# Patient Record
Sex: Male | Born: 1965 | Race: White | Hispanic: No | Marital: Married
Health system: Southern US, Community
[De-identification: ages and names within clinical notes are randomized; demographics above are authoritative.]

---

## 1998-09-30 ENCOUNTER — Encounter: Payer: Self-pay | Admitting: Family Medicine

## 1998-09-30 ENCOUNTER — Ambulatory Visit (HOSPITAL_COMMUNITY): Admission: RE | Admit: 1998-09-30 | Discharge: 1998-09-30 | Payer: Self-pay | Admitting: Family Medicine

## 2020-09-23 ENCOUNTER — Ambulatory Visit: Payer: Self-pay

## 2020-09-23 ENCOUNTER — Ambulatory Visit (INDEPENDENT_AMBULATORY_CARE_PROVIDER_SITE_OTHER): Payer: BC Managed Care – PPO | Admitting: Orthopaedic Surgery

## 2020-09-23 ENCOUNTER — Encounter: Payer: Self-pay | Admitting: Orthopaedic Surgery

## 2020-09-23 DIAGNOSIS — M79641 Pain in right hand: Secondary | ICD-10-CM | POA: Diagnosis not present

## 2020-09-23 DIAGNOSIS — M79672 Pain in left foot: Secondary | ICD-10-CM

## 2020-09-23 NOTE — Progress Notes (Signed)
Office Visit Note   Patient: Cristian Ellis           Date of Birth: 10-Apr-1966           MRN: [id number] Visit Date: 09/23/2020              Requested by: No referring provider defined for this encounter. PCP: No primary care provider on file.   Assessment & Plan: Visit Diagnoses:  1. Pain of left heel   2. Pain in right hand     Plan: Impression is right wrist pain and left heel mass.  For the wrist my suspicion is that he has got an overuse injury which gets better with rest.  Treatment recommendations were made for rest, ice, NSAIDs, mobilization as needed.  He is not overly concerned about this.  He may try Mobic again.  For the left heel this is more concerning to him and given the fact that there is a mass and has not gotten any better we need to get an MRI to fully evaluate.  I will be in touch with him regarding the results of the MRI.  Follow-Up Instructions: Return if symptoms worsen or fail to improve.   Orders:  Orders Placed This Encounter  Procedures  . XR Os Calcis Left  . XR Hand Complete Right   No orders of the defined types were placed in this encounter.     Procedures: No procedures performed   Clinical Data: No additional findings.   Subjective: Chief Complaint  Patient presents with  . Left Foot - Pain    Heel pain   . Right Wrist - Pain    Thumb pain     Cristian Ellis is a 55 year old gentleman who I know from [place] who comes in for evaluation of 2 separate issues.  The first 1 is right wrist pain which is localized to the volar aspect just radial to the FCR tendon.  He notices swelling and discomfort with increased activity such as playing tennis.  Rest has helped.  He received a cortisone injection by Dr. Grandville Silos at [place] which did not help.  Denies any numbness and tingling or burning sensation.  Mobic really helped and he denies any night pain.  He denies any triggering.  For the left heel he endorses a  sensation of a plantar mass just distal to the calcaneus.  Denies any injuries.  He reports a history of having different cysts removed from his foot but not sure which foot.  Denies any numbness and tingling.  The pain is worse in the morning when he first walks on it and he feels like he is stepping on a marshmallow.  This has been going on for about 4 months.   Review of Systems  Constitutional: Negative.   All other systems reviewed and are negative.    Objective: Vital Signs: There were no vitals taken for this visit.  Physical Exam Vitals and nursing note reviewed.  Constitutional:      Appearance: He is well-developed and well-nourished.  HENT:     Head: Normocephalic and atraumatic.  Eyes:     Pupils: Pupils are equal, round, and reactive to light.  Pulmonary:     Effort: Pulmonary effort is normal.  Abdominal:     Palpations: Abdomen is soft.  Musculoskeletal:        General: Normal range of motion.     Cervical back: Neck supple.  Skin:    General:  Skin is warm.  Neurological:     Mental Status: He is alert and oriented to person, place, and time.  Psychiatric:        Mood and Affect: Mood and affect normal.        Behavior: Behavior normal.        Thought Content: Thought content normal.        Judgment: Judgment normal.     Ortho Exam Right wrist shows normal range of motion and strength.  He has a negative grind test.  There is no triggering.  Negative carpal tunnel compressive signs.  He has some increased pain with wrist flexion.  No real tenderness of the FCR tendon.  Negative Finkelstein's.  Left heel shows a palpable soft tissue mass in the arch.  This is ill-defined.  The heel is nontender.  Lateral compression is negative.  Achilles insertion is nontender. Specialty Comments:  No specialty comments available.  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  No acute  bony abnormalities.  XR Hand Complete Right  Result Date: 09/23/2020 No acute abnormalities.  Mild thumb basal joint arthritis    PMFS History: There are no problems to display for this patient.  History reviewed. No pertinent past medical history.  History reviewed. No pertinent family history.  History reviewed. No pertinent surgical history. Social History   Occupational History  . Not on file  Tobacco Use  . Smoking status: Not on file  . Smokeless tobacco: Not on file  Substance and Sexual Activity  . Alcohol use: Not on file  . Drug use: Not on file  . Sexual activity: Not on file

## 2020-09-23 NOTE — Addendum Note (Signed)
Addended by: Precious Bard on: 09/23/2020 02:15 PM   Modules accepted: Orders

## 2020-09-25 ENCOUNTER — Telehealth: Payer: Self-pay

## 2020-09-25 NOTE — Telephone Encounter (Signed)
Order changed.

## 2020-09-25 NOTE — Telephone Encounter (Signed)
[facility] called and stated the orders for the heel and ankle need to be changed to with and without contrast. Bernice with [facility] cb # [phone] if you have any questions.

## 2020-10-23 ENCOUNTER — Other Ambulatory Visit: Payer: Self-pay

## 2020-10-23 ENCOUNTER — Ambulatory Visit
Admission: RE | Admit: 2020-10-23 | Discharge: 2020-10-23 | Disposition: A | Discharge status: Home / Self Care | Payer: BC Managed Care – PPO | Source: Ambulatory Visit | Attending: Orthopaedic Surgery | Admitting: Orthopaedic Surgery

## 2020-10-23 DIAGNOSIS — M79672 Pain in left foot: Secondary | ICD-10-CM

## 2020-10-23 MED ORDER — GADOBENATE DIMEGLUMINE 529 MG/ML IV SOLN
19.0000 mL | Freq: Once | INTRAVENOUS | Status: AC | PRN
  Administered 2020-10-23: 19 mL via INTRAVENOUS

## 2020-10-25 NOTE — Progress Notes (Signed)
Left voice mail

## 2020-11-27 ENCOUNTER — Other Ambulatory Visit: Payer: Self-pay | Admitting: Physician Assistant

## 2020-11-27 ENCOUNTER — Telehealth: Payer: Self-pay | Admitting: Orthopaedic Surgery

## 2020-11-27 MED ORDER — MELOXICAM 7.5 MG PO TABS: 7.5000 mg | ORAL_TABLET | Freq: Every day | ORAL | 2 refills | Status: DC | PRN

## 2020-11-27 NOTE — Telephone Encounter (Signed)
Patient's wife Shirlean Mylar called requesting a prescription meloxicam. Please send to [facility] on [address]. Patient phone number is [phone].

## 2020-11-27 NOTE — Telephone Encounter (Signed)
Sent in

## 2020-11-27 NOTE — Telephone Encounter (Signed)
Please advise 

## 2020-11-27 NOTE — Telephone Encounter (Signed)
Lvm informing 

## 2021-02-09 ENCOUNTER — Ambulatory Visit (HOSPITAL_COMMUNITY)
Admission: RE | Admit: 2021-02-09 | Discharge: 2021-02-09 | Disposition: A | Discharge status: Home / Self Care | Payer: BC Managed Care – PPO | Source: Ambulatory Visit | Attending: Orthopaedic Surgery | Admitting: Orthopaedic Surgery

## 2021-02-09 ENCOUNTER — Ambulatory Visit: Payer: Self-pay

## 2021-02-09 ENCOUNTER — Ambulatory Visit: Payer: BC Managed Care – PPO | Admitting: Orthopaedic Surgery

## 2021-02-09 ENCOUNTER — Telehealth: Payer: Self-pay

## 2021-02-09 ENCOUNTER — Encounter: Payer: Self-pay | Admitting: Orthopaedic Surgery

## 2021-02-09 ENCOUNTER — Other Ambulatory Visit: Payer: Self-pay

## 2021-02-09 VITALS — Ht 74.0 in | Wt 205.0 lb

## 2021-02-09 DIAGNOSIS — M7989 Other specified soft tissue disorders: Secondary | ICD-10-CM

## 2021-02-09 DIAGNOSIS — G8929 Other chronic pain: Secondary | ICD-10-CM

## 2021-02-09 DIAGNOSIS — M25561 Pain in right knee: Secondary | ICD-10-CM | POA: Diagnosis not present

## 2021-02-09 DIAGNOSIS — M549 Dorsalgia, unspecified: Secondary | ICD-10-CM | POA: Diagnosis not present

## 2021-02-09 DIAGNOSIS — M79661 Pain in right lower leg: Secondary | ICD-10-CM | POA: Diagnosis not present

## 2021-02-09 MED ORDER — LIDOCAINE HCL 1 % IJ SOLN
2.0000 mL | INTRAMUSCULAR | Status: AC | PRN
  Administered 2021-02-09: 2 mL

## 2021-02-09 MED ORDER — METHYLPREDNISOLONE ACETATE 40 MG/ML IJ SUSP
40.0000 mg | INTRAMUSCULAR | Status: AC | PRN
  Administered 2021-02-09: 40 mg via INTRA_ARTICULAR

## 2021-02-09 MED ORDER — BUPIVACAINE HCL 0.25 % IJ SOLN
2.0000 mL | INTRAMUSCULAR | Status: AC | PRN
  Administered 2021-02-09: 2 mL via INTRA_ARTICULAR

## 2021-02-09 NOTE — Progress Notes (Signed)
Office Visit Note   Patient: Cristian Ellis           Date of Birth: Aug 25, 1965           MRN: [id number] Visit Date: 02/09/2021              Requested by: Merrilee Seashore, [address] [place],  [place] [address] PCP: Merrilee Seashore, MD   Assessment & Plan: Visit Diagnoses:  1. Chronic pain of right knee   2. Upper back pain   3. Pain and swelling of right lower leg     Plan: Impression is right knee probable early arthritis flareup, right calf pain and upper thoracic spine pain.  In regards to the knee, feel his pain is likely coming from early arthritis given his symptoms.  We have discussed cortisone injection today for which she would like to proceed.  In regards to the calf pain, I believe this is more likely coming from walking with an antalgic gait from his underlying knee pain rather than a true DVT, however I feel it is appropriate to order a Doppler ultrasound to rule this out.  In regards to the upper thoracic spine pain, I feel as though he would benefit from a course of physical therapy for: Strengthening exercises.  He will follow-up with [place] as needed.  Follow-Up Instructions: Return if symptoms worsen or fail to improve.   Orders:  Orders Placed This Encounter  Procedures   Large Joint Inj: R knee   XR KNEE 3 VIEW RIGHT   XR Cervical Spine 2 or 3 views   XR Thoracic Spine 2 View   Ambulatory referral to Physical Therapy   VAS [place] LOWER EXTREMITY VENOUS (DVT)   No orders of the defined types were placed in this encounter.     Procedures: Large Joint Inj: R knee on 02/09/2021 8:47 AM Indications: pain Details: 22 G needle, anterolateral approach Medications: 2 mL lidocaine 1 %; 2 mL bupivacaine 0.25 %; 40 mg methylPREDNISolone acetate 40 MG/ML     Clinical Data: No additional findings.   Subjective: Chief Complaint  Patient presents with   Right Knee - Pain    HPI patient is a pleasant 55 year old gentleman who comes in  today with right knee pain, right calf pain and upper thoracic spine pain.  In regards to his back, he has had pain here for the past few months.  No known injury or change in activity.  The pain is primarily to the anterolateral aspect.  No mechanical symptoms.  He has increased pain with sitting to standing.  He has been taking Advil which minimally helps.  He denies any previous cortisone injection or surgical intervention.  In regards to his calf pain, he has had pain here for the past 5 days.  No known injury or change in activity.  No history of smoking.  No personal or family history of DVT/PE.  No chest pain or shortness of breath.  In regards to the pain in his thoracic spine, he has had pain here for many months.  He feels like he is losing strength.  No paresthesias to either upper or lower extremities. Review of Systems as detailed in HPI.  All others reviewed and are negative.   Objective: Vital Signs: Ht 6\' 2"  (1.88 m)   Wt 205 lb (93 kg)   BMI 26.32 kg/m   Physical Exam well-developed well-nourished gentleman in no acute distress.  Alert and oriented x3.  Ortho  Exam right knee exam shows trace effusion.  Range of motion 5 to 120 degrees.  Minimal medial and lateral joint line tenderness.  Ligaments are stable.  He is neurovascular intact distally peer  Specialty Comments:  No specialty comments available.    Imaging: XR Thoracic Spine 2 View  Result Date: 02/09/2021 No acute or structural abnormalities  XR Cervical Spine 2 or 3 views  Result Date: 02/09/2021 Mild diffuse degenerative changes  XR KNEE 3 VIEW RIGHT  Result Date: 02/09/2021 Mild to moderate degenerative changes to the medial joint line.    PMFS History: There are no problems to display for this patient.  History reviewed. No pertinent past medical history.  History reviewed. No pertinent family history.  History reviewed. No pertinent surgical history. Social History   Occupational History   Not on  file  Tobacco Use   Smoking status: Not on file   Smokeless tobacco: Not on file  Substance and Sexual Activity   Alcohol use: Not on file   Drug use: Not on file   Sexual activity: Not on file

## 2021-02-09 NOTE — Telephone Encounter (Signed)
thanks

## 2021-02-09 NOTE — Telephone Encounter (Signed)
FYI  [facility] called and stated pt was negative for DVT on right leg

## 2021-03-23 ENCOUNTER — Other Ambulatory Visit: Payer: Self-pay | Admitting: Physician Assistant

## 2021-03-29 ENCOUNTER — Telehealth: Payer: Self-pay | Admitting: Physician Assistant

## 2021-03-29 NOTE — Telephone Encounter (Signed)
Called patient got recording patient has mailbox that has not been set up yet     will try back later

## 2021-03-29 NOTE — Telephone Encounter (Signed)
Patient's wife Shirlean Mylar called needing Rx refilled Meloxicam foe the patient. The number to contact Shirlean Mylar is [phone]

## 2021-03-30 ENCOUNTER — Other Ambulatory Visit: Payer: Self-pay | Admitting: Physician Assistant

## 2021-03-30 MED ORDER — MELOXICAM 7.5 MG PO TABS: 7.5000 mg | ORAL_TABLET | Freq: Every day | ORAL | 2 refills | Status: DC | PRN

## 2021-03-30 NOTE — Telephone Encounter (Signed)
Sent in

## 2022-01-17 NOTE — Progress Notes (Unsigned)
    Subjective:    CC: L shoulder pain  I, Molly Weber, LAT, ATC, am serving as scribe for Dr. Lynne Leader.  HPI: Pt is a 56 y/o male presenting w/ c/o L shoulder pain x .  He locates his pain to .  Radiating pain: L shoulder mechanical symptoms: Aggravating factors: Treatments tried:   Pertinent review of Systems: ***  Relevant historical information: ***   Objective:   There were no vitals filed for this visit. General: Well Developed, well nourished, and in no acute distress.   MSK: ***  Lab and Radiology Results No results found for this or any previous visit (from the past 72 hour(s)). No results found.    Impression and Recommendations:    Assessment and Plan: 56 y.o. male with ***.  PDMP not reviewed this encounter. No orders of the defined types were placed in this encounter.  No orders of the defined types were placed in this encounter.   Discussed warning signs or symptoms. Please see discharge instructions. Patient expresses understanding.   ***

## 2022-01-18 ENCOUNTER — Ambulatory Visit: Payer: Self-pay

## 2022-01-18 ENCOUNTER — Ambulatory Visit: Payer: BC Managed Care – PPO | Admitting: Family Medicine

## 2022-01-18 VITALS — BP 92/66 | HR 74 | Ht 74.0 in | Wt 204.4 lb

## 2022-01-18 DIAGNOSIS — L4050 Arthropathic psoriasis, unspecified: Secondary | ICD-10-CM | POA: Insufficient documentation

## 2022-01-18 DIAGNOSIS — C439 Malignant melanoma of skin, unspecified: Secondary | ICD-10-CM | POA: Insufficient documentation

## 2022-01-18 DIAGNOSIS — M25512 Pain in left shoulder: Secondary | ICD-10-CM | POA: Diagnosis not present

## 2022-01-18 DIAGNOSIS — M1990 Unspecified osteoarthritis, unspecified site: Secondary | ICD-10-CM | POA: Insufficient documentation

## 2022-01-18 DIAGNOSIS — G8929 Other chronic pain: Secondary | ICD-10-CM

## 2022-01-18 NOTE — Patient Instructions (Addendum)
Good to see you today.  You had a L shoulder injection.  Call or go to the ER if you develop a large red swollen joint with extreme pain or oozing puss.   I've referred you to Physical Therapy at [facility].  Their office will call you to schedule but please let us know if you don't hear from them in one week regarding scheduling.  Follow-up: 6 weeks

## 2022-01-26 ENCOUNTER — Encounter: Payer: Self-pay | Admitting: Physical Therapy

## 2022-01-26 ENCOUNTER — Ambulatory Visit (INDEPENDENT_AMBULATORY_CARE_PROVIDER_SITE_OTHER): Payer: BC Managed Care – PPO | Admitting: Physical Therapy

## 2022-01-26 DIAGNOSIS — M6281 Muscle weakness (generalized): Secondary | ICD-10-CM

## 2022-01-26 DIAGNOSIS — M25512 Pain in left shoulder: Secondary | ICD-10-CM

## 2022-01-26 NOTE — Therapy (Signed)
OUTPATIENT PHYSICAL THERAPY SHOULDER EVALUATION   Patient Name: Cristian Ellis MRN: [id number] DOB:1965-09-16, 56 y.o., male Today's Date: 01/26/2022   PT End of Session - 01/26/22 1301     Visit Number 1    Number of Visits 16    Date for PT Re-Evaluation 03/23/22    Authorization Type BCBS    PT Start Time 1302    PT Stop Time 1341    PT Time Calculation (min) 39 min             History reviewed. No pertinent past medical history. History reviewed. No pertinent surgical history. Patient Active Problem List   Diagnosis Date Noted   Psoriatic arthritis ([place]) 01/18/2022   Osteoarthritis 01/18/2022   Malignant melanoma of skin ([place]) 01/18/2022    PCP: Merrilee Seashore, MD  REFERRING PROVIDER:  Gregor Hams, MD  REFERRING DIAG: [phone] (ICD-10-CM) - Chronic left shoulder pain  THERAPY DIAG:  Acute pain of left shoulder - Plan: PT plan of care cert/re-cert  Muscle weakness (generalized) - Plan: PT plan of care cert/re-cert  Rationale for Evaluation and Treatment Rehabilitation  ONSET DATE: Nov 2022  SUBJECTIVE:                                                                                                                                                                                      SUBJECTIVE STATEMENT: Patient Had a fall  around thanksgiving 2022 and broke fall by reaching out with left arm. It was very jarring and painful but felt better. Continued to hurt with motion but not at rest. Did not get better and sought out MD. Has received two injections in his shoulder, 2nd injection seemed to help more than first.   States that when ever he makes a sudden movement he has pain. But that the pain doesn't linger. States that when he stretches it really hurts and so he doesn't want to stretch it  Reports he plays golf and doesn't want to play golf due to pain. Tennis doesn't bother it but he is right handed. 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   History of possible right frozen shoulder following R RCR.      PERTINENT HISTORY: Psoriatic  arthritis, R RCR with s/p frozen shoulder, Achille's repair  PAIN:  Are you having pain? Yes: NPRS scale: 7/10 Pain location: top of left shoulder deltoid region Pain description: sharp Aggravating factors: movement Relieving factors: rest, no contact  PRECAUTIONS: None  WEIGHT BEARING RESTRICTIONS No  FALLS:  Has patient fallen in last 6 months? No   OCCUPATION: Teacher and basketball coach  PLOF: Independent  PATIENT GOALS to have less pain and get arm back to normal  OBJECTIVE:   DIAGNOSTIC FINDINGS:  No recent xray [place] - no significant findings  COGNITION:  Overall cognitive status: Within functional limits for tasks assessed     SENSATION: WFL  POSTURE: Rounded shoulders, increased kyphosis, forward head     UE Measurements Upper Extremity Right 01/26/2022 Left 01/26/2022   A/PROM MMT A/PROM MMT  Shoulder Flexion 150  92*   Shoulder Extension      Shoulder Abduction 170  89* shoulder hike   Shoulder Adduction      Shoulder Internal rotation Reaches to right 12th rib/60 at 90 abd   Reaches to left ilium*/ at 45 abd 45*   Shoulder External rotation Reaches to T2SP - shoulder hike*/ 90 at 90 abd  Reaches to T2SP/ at 45 abd 32*    (Blank rows = not tested)  * pain     JOINT MOBILITY TESTING:  Empty end feel left shoulder   PALPATION:  Tenderness to palpation along left shoulder girdle and muscles   TODAY'S TREATMENT:  01/26/2022 Therapeutic Exercise:  Aerobic: Supine: AAROM shoulder ER with cane 3 minutes Prone:  Seated: AAROM shoulder flexion table slide 3 mintues   Standing: Neuromuscular Re-education: Manual Therapy: Therapeutic Activity: Self Care: Trigger Point Dry Needling:  Modalities:     PATIENT EDUCATION:  Education details: on current presentation, on HEP, on clinical outcomes score and POC, on  desensitization of left shoulder  Person educated: Patient Education method: Explanation, Demonstration, and Handouts Education comprehension: verbalized understanding   HOME EXERCISE PROGRAM: [id number]  ASSESSMENT:  CLINICAL IMPRESSION: Patient is a 56 y.o. male who was seen today for physical therapy evaluation and treatment for left shoulder pain.Patient with fall last year and with increased pain and reduced mobility since fall. Patient presents with stiffness and pain limiting left shoulder motion.Educated patient on current presentation and POC. Patent would greatly benefit from skilled PT to improve overall motion and function in left shoulder.    OBJECTIVE IMPAIRMENTS decreased activity tolerance, decreased ROM, decreased strength, impaired UE functional use, postural dysfunction, and pain.   ACTIVITY LIMITATIONS carrying, lifting, sleeping, and reach over head  PARTICIPATION LIMITATIONS: cleaning, occupation, and [place] Age and 1 comorbidity: history of right adhesive capsulitis  are also affecting patient's functional outcome.   REHAB POTENTIAL: Good  CLINICAL DECISION MAKING: Stable/uncomplicated  EVALUATION COMPLEXITY: Low  GOALS: Goals reviewed with patient?  yes  SHORT TERM GOALS:  Patient will be independent in self management strategies to improve quality of life and functional outcomes. Baseline: new program Target date: 02/23/2022 Goal status: INITIAL  2.  Patient will report at least 50% improvement in overall symptoms and/or function to demonstrate improved functional mobility Baseline: 0% Target date: 02/23/2022 Goal status: INITIAL  3.  Patient will be able to demonstrate at least 120 degrees of left shoulder flexion and abd to improve ability to reach overhead Baseline: see above Target date: 02/23/2022 Goal status: INITIAL      LONG TERM GOALS:  Patient will report at least 75% improvement in overall symptoms and/or  function to demonstrate improved functional mobility Baseline: 0% Target date: 03/09/2022 Goal status: INITIAL  2.  Patient will be able to reach out to the side without pain to improve ability to coach basketball Baseline: unable Target date: 03/09/2022 Goal status: INITIAL  3.  Patient will be able to demonstrate WNL of left shoulder motion to improve functional mobility of left UE.  Baseline: see above Target date: 03/09/2022 Goal status: INITIAL    PLAN: PT FREQUENCY: 2x/week  PT DURATION: 8 weeks  PLANNED INTERVENTIONS: Therapeutic exercises, Therapeutic activity, Neuromuscular re-education, Balance training, Gait training, Patient/Family education, Joint mobilization, Stair training, Vestibular training, Orthotic/Fit training, DME instructions, Aquatic Therapy, Dry Needling, Cryotherapy, Ionotophoresis '4mg'$ /ml Dexamethasone, Manual therapy, and Re-evaluation  PLAN FOR NEXT SESSION: ROM, STM, DN, heat, iso  2:56 PM, 01/26/22 Jerene Pitch, DPT Physical Therapy with [facility]

## 2022-02-03 NOTE — Therapy (Signed)
OUTPATIENT PHYSICAL THERAPY TREATMENT NOTE   Patient Name: Cristian Ellis MRN: [id number] DOB:1966/01/17, 56 y.o., male Today's Date: 02/07/2022   END OF SESSION:   PT End of Session - 02/07/22 1100     Visit Number 2    Number of Visits 16    Date for PT Re-Evaluation 03/23/22    Authorization Type BCBS    PT Start Time 1101    PT Stop Time 1140    PT Time Calculation (min) 39 min             History reviewed. No pertinent past medical history. History reviewed. No pertinent surgical history. Patient Active Problem List   Diagnosis Date Noted   Psoriatic arthritis ([place]) 01/18/2022   Osteoarthritis 01/18/2022   Malignant melanoma of skin ([place]) 01/18/2022    PCP: Merrilee Seashore, MD   REFERRING PROVIDER:  Gregor Hams, MD   REFERRING DIAG: [phone] (ICD-10-CM) - Chronic left shoulder pain   THERAPY DIAG:  Acute pain of left shoulder - Plan: PT plan of care cert/re-cert   Muscle weakness (generalized) - Plan: PT plan of care cert/re-cert   Rationale for Evaluation and Treatment Rehabilitation   ONSET DATE: Nov 2022   SUBJECTIVE:                                                                                                                                                                                       SUBJECTIVE STATEMENT: Feels a little better and maybe the cortisone shot helped a bit.    Eval: Patient Had a fall  around thanksgiving 2022 and broke fall by reaching out with left arm. It was very jarring and painful but felt better. Continued to hurt with motion but not at rest. Did not get better and sought out MD. Has received two injections in his shoulder, 2nd injection seemed to help more than first.    States that when ever he makes a sudden movement he has pain. But that the pain doesn't linger. States that when he stretches it really hurts and so he doesn't want to stretch it   Reports he plays golf and doesn't want to play golf due  to pain. Tennis doesn't bother it but he is right handed. 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    History of possible right frozen shoulder following R RCR.          PERTINENT HISTORY: Psoriatic  arthritis, R RCR with s/p frozen shoulder, Achille's repair   PAIN:  Are you having pain? Yes: NPRS scale: 3/10 Pain location: top of left shoulder deltoid region Pain description: sharp  Aggravating factors: movement Relieving factors: rest, no contact   PRECAUTIONS: None   WEIGHT BEARING RESTRICTIONS No   FALLS:  Has patient fallen in last 6 months? No     OCCUPATION: Teacher and basketball coach   PLOF: Independent   PATIENT GOALS to have less pain and get arm back to normal   OBJECTIVE:    DIAGNOSTIC FINDINGS:  No recent xray [place] - no significant findings   COGNITION:           Overall cognitive status: Within functional limits for tasks assessed                                  SENSATION: WFL   POSTURE: Rounded shoulders, increased kyphosis, forward head                 UE Measurements       Upper Extremity Right 01/26/2022 Left 01/26/2022    A/PROM MMT A/PROM MMT  Shoulder Flexion 150   92*    Shoulder Extension          Shoulder Abduction 170   89* shoulder hike    Shoulder Adduction          Shoulder Internal rotation Reaches to right 12th rib/60 at 90 abd    Reaches to left ilium*/ at 45 abd 45*    Shoulder External rotation Reaches to T2SP - shoulder hike*/ 90 at 90 abd   Reaches to T2SP/ at 45 abd 32*     (Blank rows = not tested)            * pain         JOINT MOBILITY TESTING:  Empty end feel left shoulder    PALPATION:  Tenderness to palpation along left shoulder girdle and muscles             TODAY'S TREATMENT:  02/07/2022  Therapeutic Exercise:            Aerobic: Supine: neck ROT with scap retraction x10 5" holds, bench press with dowel x15 5" hold, AAROM shoulder flexion dowel x2 1 minute each, shoulder  retraction x20 5" holds B, shoulder ER holds black band x20 5" holds  Prone:            Seated: AAROM shoulder flexion table slide 3 mintues             Standing: shoulder Ext dowel 2 minutes -cuse for posture Neuromuscular Re-education: Manual Therapy: PROM to left shoulder contract/relax gentle traction - all directions, STM to left shoulder muscles. Therapeutic Activity: Self Care: Trigger Point Dry Needling:  Modalities:        PATIENT EDUCATION:  Education details: on HEP Person educated: Patient Education method: Explanation, Demonstration, and Handouts Education comprehension: verbalized understanding     HOME EXERCISE PROGRAM: [id number]   ASSESSMENT:   CLINICAL IMPRESSION: 02/07/2022 Overall improved motion noted on this date. Tolerated all new exercises. Pain and guarding but reduced compared to previous session. Added all new exercises to HEP. Reduced tension noted end of session and improved movement. Will continue with current POC as tolerated.   Eval: Patient is a 56 y.o. male who was seen today for physical therapy evaluation and treatment for left shoulder pain.Patient with fall last year and with increased pain and reduced mobility since fall. Patient presents with stiffness and pain limiting left shoulder motion.Educated patient on current presentation and POC. Patent would  greatly benefit from skilled PT to improve overall motion and function in left shoulder.      OBJECTIVE IMPAIRMENTS decreased activity tolerance, decreased ROM, decreased strength, impaired UE functional use, postural dysfunction, and pain.    ACTIVITY LIMITATIONS carrying, lifting, sleeping, and reach over head   PARTICIPATION LIMITATIONS: cleaning, occupation, and [place] Age and 1 comorbidity: history of right adhesive capsulitis  are also affecting patient's functional outcome.    REHAB POTENTIAL: Good   CLINICAL DECISION MAKING: Stable/uncomplicated   EVALUATION  COMPLEXITY: Low   GOALS: Goals reviewed with patient?  yes   SHORT TERM GOALS:   Patient will be independent in self management strategies to improve quality of life and functional outcomes. Baseline: new program Target date: 02/23/2022 Goal status: INITIAL   2.  Patient will report at least 50% improvement in overall symptoms and/or function to demonstrate improved functional mobility Baseline: 0% Target date: 02/23/2022 Goal status: INITIAL   3.  Patient will be able to demonstrate at least 120 degrees of left shoulder flexion and abd to improve ability to reach overhead Baseline: see above Target date: 02/23/2022 Goal status: INITIAL           LONG TERM GOALS:   Patient will report at least 75% improvement in overall symptoms and/or function to demonstrate improved functional mobility Baseline: 0% Target date: 03/09/2022 Goal status: INITIAL   2.  Patient will be able to reach out to the side without pain to improve ability to coach basketball Baseline: unable Target date: 03/09/2022 Goal status: INITIAL   3.  Patient will be able to demonstrate WNL of left shoulder motion to improve functional mobility of left UE. Baseline: see above Target date: 03/09/2022 Goal status: INITIAL       PLAN: PT FREQUENCY: 2x/week   PT DURATION: 8 weeks   PLANNED INTERVENTIONS: Therapeutic exercises, Therapeutic activity, Neuromuscular re-education, Balance training, Gait training, Patient/Family education, Joint mobilization, Stair training, Vestibular training, Orthotic/Fit training, DME instructions, Aquatic Therapy, Dry Needling, Cryotherapy, Ionotophoresis '4mg'$ /ml Dexamethasone, Manual therapy, and Re-evaluation   PLAN FOR NEXT SESSION: ROM, STM, DN, heat, iso   11:41 AM, 02/07/22 Jerene Pitch, DPT Physical Therapy with Royston Sinner

## 2022-02-07 ENCOUNTER — Encounter: Payer: Self-pay | Admitting: Physical Therapy

## 2022-02-07 ENCOUNTER — Ambulatory Visit (INDEPENDENT_AMBULATORY_CARE_PROVIDER_SITE_OTHER): Payer: BC Managed Care – PPO | Admitting: Physical Therapy

## 2022-02-07 DIAGNOSIS — M25512 Pain in left shoulder: Secondary | ICD-10-CM | POA: Diagnosis not present

## 2022-02-07 DIAGNOSIS — M6281 Muscle weakness (generalized): Secondary | ICD-10-CM

## 2022-02-09 ENCOUNTER — Ambulatory Visit (INDEPENDENT_AMBULATORY_CARE_PROVIDER_SITE_OTHER): Payer: BC Managed Care – PPO | Admitting: Physical Therapy

## 2022-02-09 ENCOUNTER — Encounter: Payer: Self-pay | Admitting: Physical Therapy

## 2022-02-09 DIAGNOSIS — M6281 Muscle weakness (generalized): Secondary | ICD-10-CM

## 2022-02-09 DIAGNOSIS — M25512 Pain in left shoulder: Secondary | ICD-10-CM

## 2022-02-09 NOTE — Therapy (Signed)
OUTPATIENT PHYSICAL THERAPY TREATMENT NOTE   Patient Name: PIO EATHERLY MRN: [id number] DOB:1965/11/28, 56 y.o., male Today's Date: 02/09/2022   END OF SESSION:   PT End of Session - 02/09/22 0930     Visit Number 3    Number of Visits 16    Date for PT Re-Evaluation 03/23/22    Authorization Type BCBS    PT Start Time 0846    PT Stop Time 0927    PT Time Calculation (min) 41 min    Activity Tolerance Patient tolerated treatment well    Behavior During Therapy [facility] for tasks assessed/performed              History reviewed. No pertinent past medical history. History reviewed. No pertinent surgical history. Patient Active Problem List   Diagnosis Date Noted   Psoriatic arthritis ([place]) 01/18/2022   Osteoarthritis 01/18/2022   Malignant melanoma of skin ([place]) 01/18/2022    PCP: Merrilee Seashore, MD   REFERRING PROVIDER:  Gregor Hams, MD   REFERRING DIAG: [phone] (ICD-10-CM) - Chronic left shoulder pain   THERAPY DIAG:  Acute pain of left shoulder - Plan: PT plan of care cert/re-cert   Muscle weakness (generalized) - Plan: PT plan of care cert/re-cert   Rationale for Evaluation and Treatment Rehabilitation   ONSET DATE: Nov 2022   SUBJECTIVE:                                                                                                                                                                                       SUBJECTIVE STATEMENT: States shoulder is improving, sleeping better. Still feels stiff.    Eval: Patient Had a fall  around thanksgiving 2022 and broke fall by reaching out with left arm. It was very jarring and painful but felt better. Continued to hurt with motion but not at rest. Did not get better and sought out MD. Has received two injections in his shoulder, 2nd injection seemed to help more than first.    States that when ever he makes a sudden movement he has pain. But that the pain doesn't linger. States that when he  stretches it really hurts and so he doesn't want to stretch it   Reports he plays golf and doesn't want to play golf due to pain. Tennis doesn't bother it but he is right handed. 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    History of possible right frozen shoulder following R RCR.          PERTINENT HISTORY: Psoriatic  arthritis, R RCR with s/p frozen shoulder, Achille's repair   PAIN:  Are you  having pain? Yes: NPRS scale: 3/10 Pain location: top of left shoulder deltoid region Pain description: sharp Aggravating factors: movement Relieving factors: rest, no contact   PRECAUTIONS: None   WEIGHT BEARING RESTRICTIONS No   FALLS:  Has patient fallen in last 6 months? No     OCCUPATION: Teacher and basketball coach   PLOF: Independent   PATIENT GOALS to have less pain and get arm back to normal   OBJECTIVE:    DIAGNOSTIC FINDINGS:  No recent xray [place] - no significant findings   COGNITION:           Overall cognitive status: Within functional limits for tasks assessed                                  SENSATION: WFL   POSTURE: Rounded shoulders, increased kyphosis, forward head                 UE Measurements        Upper Extremity Right 01/26/2022 Left 01/26/2022 Left    A/PROM MMT A/PROM MMT A/PROM  Shoulder Flexion 150   92*   /125  Shoulder Extension           Shoulder Abduction 170   89* shoulder hike     Shoulder Adduction           Shoulder Internal rotation Reaches to right 12th rib/60 at 90 abd    Reaches to left ilium*/ at 45 abd 45*   /50 (supine)  Shoulder External rotation Reaches to T2SP - shoulder hike*/ 90 at 90 abd   Reaches to T2SP/ at 45 abd 32*      (Blank rows = not tested)            * pain         JOINT MOBILITY TESTING:  Empty end feel left shoulder    PALPATION:  Tenderness to palpation along left shoulder girdle and muscles             TODAY'S TREATMENT:  02/09/2022 Therapeutic Exercise:             Aerobic: Supine: AAROM shoulder flexion dowel x 20;  shoulder retraction x20 5" holds B, shoulder ER butterfly 5 sec x 15;  Prone:            Seated: pulley x 3 min flex and abd             Standing: shoulder ER holds blue band x20 5" hold; Shoulder ext x 10 and IR with cane 1 min x 3 ; Neuromuscular Re-education: Manual Therapy: PROM to left shoulder all motions, contract/relax for IR;  LAD; GHJ mobs post and inf Therapeutic Activity:   Previous:  Therapeutic Exercise:            Aerobic: Supine: neck ROT with scap retraction x10 5" holds,  AAROM shoulder flexion dowel x 20;  shoulder retraction x20 5" holds B, shoulder ER holds black band x20 5" holds  Prone:            Seated: AAROM shoulder flexion table slide 3 mintues             Standing: shoulder Ext dowel 2 minutes -cuse for posture Neuromuscular Re-education: Manual Therapy: PROM to left shoulder contract/relax gentle traction - all directions, STM to left shoulder muscles. Therapeutic Activity: Self Care: Trigger Point Dry Needling:  Modalities:  PATIENT EDUCATION:  Education details: on HEP Person educated: Patient Education method: Explanation, Media planner, and Handouts Education comprehension: verbalized understanding     HOME EXERCISE PROGRAM: [id number]   ASSESSMENT:   CLINICAL IMPRESSION: 02/09/2022 Pt with good tolerance for ther ex, ROM, and Manual today. Still has stiffness in shoulder that is limiting full ROM, and will benefit from continued focus on this. Pain levels seem to be improved from prevoius weeks since injection.   Eval: Patient is a 56 y.o. male who was seen today for physical therapy evaluation and treatment for left shoulder pain.Patient with fall last year and with increased pain and reduced mobility since fall. Patient presents with stiffness and pain limiting left shoulder motion.Educated patient on current presentation and POC. Patent would greatly benefit from skilled PT to  improve overall motion and function in left shoulder.      OBJECTIVE IMPAIRMENTS decreased activity tolerance, decreased ROM, decreased strength, impaired UE functional use, postural dysfunction, and pain.    ACTIVITY LIMITATIONS carrying, lifting, sleeping, and reach over head   PARTICIPATION LIMITATIONS: cleaning, occupation, and [place] Age and 1 comorbidity: history of right adhesive capsulitis  are also affecting patient's functional outcome.    REHAB POTENTIAL: Good   CLINICAL DECISION MAKING: Stable/uncomplicated   EVALUATION COMPLEXITY: Low   GOALS: Goals reviewed with patient?  yes   SHORT TERM GOALS:   Patient will be independent in self management strategies to improve quality of life and functional outcomes. Baseline: new program Target date: 02/23/2022 Goal status: INITIAL   2.  Patient will report at least 50% improvement in overall symptoms and/or function to demonstrate improved functional mobility Baseline: 0% Target date: 02/23/2022 Goal status: INITIAL   3.  Patient will be able to demonstrate at least 120 degrees of left shoulder flexion and abd to improve ability to reach overhead Baseline: see above Target date: 02/23/2022 Goal status: INITIAL           LONG TERM GOALS:   Patient will report at least 75% improvement in overall symptoms and/or function to demonstrate improved functional mobility Baseline: 0% Target date: 03/09/2022 Goal status: INITIAL   2.  Patient will be able to reach out to the side without pain to improve ability to coach basketball Baseline: unable Target date: 03/09/2022 Goal status: INITIAL   3.  Patient will be able to demonstrate WNL of left shoulder motion to improve functional mobility of left UE. Baseline: see above Target date: 03/09/2022 Goal status: INITIAL       PLAN: PT FREQUENCY: 2x/week   PT DURATION: 8 weeks   PLANNED INTERVENTIONS: Therapeutic exercises, Therapeutic activity,  Neuromuscular re-education, Balance training, Gait training, Patient/Family education, Joint mobilization, Stair training, Vestibular training, Orthotic/Fit training, DME instructions, Aquatic Therapy, Dry Needling, Cryotherapy, Ionotophoresis '4mg'$ /ml Dexamethasone, Manual therapy, and Re-evaluation   PLAN FOR NEXT SESSION: ROM, STM, DN, heat, iso    Lyndee Hensen, PT, DPT 9:31 AM  02/09/22

## 2022-02-14 ENCOUNTER — Encounter: Payer: Self-pay | Admitting: Physical Therapy

## 2022-02-14 ENCOUNTER — Ambulatory Visit: Payer: BC Managed Care – PPO | Admitting: Physical Therapy

## 2022-02-14 DIAGNOSIS — M6281 Muscle weakness (generalized): Secondary | ICD-10-CM | POA: Diagnosis not present

## 2022-02-14 DIAGNOSIS — M25512 Pain in left shoulder: Secondary | ICD-10-CM

## 2022-02-14 NOTE — Therapy (Signed)
OUTPATIENT PHYSICAL THERAPY TREATMENT NOTE   Patient Name: Cristian Ellis MRN: [id number] DOB:07-Aug-1965, 56 y.o., male Today's Date: 02/14/2022   END OF SESSION:   PT End of Session - 02/14/22 0801     Visit Number 4    Number of Visits 16    Date for PT Re-Evaluation 03/23/22    Authorization Type BCBS    PT Start Time 0803    PT Stop Time 0841    PT Time Calculation (min) 38 min    Activity Tolerance Patient tolerated treatment well    Behavior During Therapy [facility] for tasks assessed/performed              History reviewed. No pertinent past medical history. History reviewed. No pertinent surgical history. Patient Active Problem List   Diagnosis Date Noted   Psoriatic arthritis ([place]) 01/18/2022   Osteoarthritis 01/18/2022   Malignant melanoma of skin ([place]) 01/18/2022    PCP: Merrilee Seashore, MD   REFERRING PROVIDER:  Gregor Hams, MD   REFERRING DIAG: [phone] (ICD-10-CM) - Chronic left shoulder pain   THERAPY DIAG:  Acute pain of left shoulder - Plan: PT plan of care cert/re-cert   Muscle weakness (generalized) - Plan: PT plan of care cert/re-cert   Rationale for Evaluation and Treatment Rehabilitation   ONSET DATE: Nov 2022   SUBJECTIVE:                                                                                                                                                                                       SUBJECTIVE STATEMENT: 02/14/2022 States that he was in [facility] and his shoulder was not feeling well but now is better now that he is back in [place]   Eval: Patient Had a fall  around thanksgiving 2022 and broke fall by reaching out with left arm. It was very jarring and painful but felt better. Continued to hurt with motion but not at rest. Did not get better and sought out MD. Has received two injections in his shoulder, 2nd injection seemed to help more than first.    States that when ever he makes a sudden movement he has  pain. But that the pain doesn't linger. States that when he stretches it really hurts and so he doesn't want to stretch it   Reports he plays golf and doesn't want to play golf due to pain. Tennis doesn't bother it but he is right handed. 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    History of possible right frozen shoulder following R RCR.          PERTINENT HISTORY: Psoriatic  arthritis, R RCR with s/p frozen shoulder, Achille's repair   PAIN:7 Are you having pain? Yes: NPRS scale: 3/10 Pain location: top of left shoulder deltoid region Pain description: stiffness Aggravating factors: movement Relieving factors: rest, no contact   PRECAUTIONS: None   WEIGHT BEARING RESTRICTIONS No   FALLS:  Has patient fallen in last 6 months? No     OCCUPATION: Teacher and basketball coach   PLOF: Independent   PATIENT GOALS to have less pain and get arm back to normal   OBJECTIVE:    DIAGNOSTIC FINDINGS:  No recent xray [place] - no significant findings   COGNITION:           Overall cognitive status: Within functional limits for tasks assessed                                  SENSATION: WFL   POSTURE: Rounded shoulders, increased kyphosis, forward head                 UE Measurements        Upper Extremity Right 01/26/2022 Left 01/26/2022 Left    A/PROM MMT A/PROM MMT A/PROM  Shoulder Flexion 150   92*   /125  Shoulder Extension           Shoulder Abduction 170   89* shoulder hike     Shoulder Adduction           Shoulder Internal rotation Reaches to right 12th rib/60 at 90 abd    Reaches to left ilium*/ at 45 abd 45*   /50 (supine)  Shoulder External rotation Reaches to T2SP - shoulder hike*/ 90 at 90 abd   Reaches to T2SP/ at 45 abd 32*      (Blank rows = not tested)            * pain         JOINT MOBILITY TESTING:  Empty end feel left shoulder    PALPATION:  Tenderness to palpation along left shoulder girdle and muscles              TODAY'S TREATMENT:  02/14/2022 Therapeutic Exercise:            Aerobic: Supine: AAROM shoulder flexion dowel x 3 minutes; horizontal shoulder abd x20 B    shoulder retraction x20 5" holds B, shoulder ER butterfly 5 sec x 15;  Prone:            Seated: pulley x 3 min flex and abd            Standing: shoulder flexion AAROM at wall with ball 3 minutes    self mobilization with tennis ball to shoulder region 6 minute, shoulder Ext with dowel 2x10 5" holds Neuromuscular Re-education: Manual Therapy: PROM to left shoulder all motions, contract/relax for IR;  LAD; GHJ mobs post and inf grade II/III Therapeutic Activity:   Previous:  Therapeutic Exercise:            Aerobic: Supine: neck ROT with scap retraction x10 5" holds,  AAROM shoulder flexion dowel x 20;  shoulder retraction x20 5" holds B, shoulder ER holds black band x20 5" holds  Prone:            Seated: AAROM shoulder flexion table slide 3 mintues             Standing: shoulder Ext dowel 2 minutes -cuse for posture Neuromuscular Re-education:  Manual Therapy: PROM to left shoulder contract/relax gentle traction - all directions, STM to left shoulder muscles. Therapeutic Activity: Self Care: Trigger Point Dry Needling:  Modalities:        PATIENT EDUCATION:  Education details: on HEP Person educated: Patient Education method: Explanation, Media planner, and Handouts Education comprehension: verbalized understanding     HOME EXERCISE PROGRAM: [id number]   ASSESSMENT:   CLINICAL IMPRESSION: 02/14/2022 Overall tolerated session well. Added self mobilization which was tolerated well. Improved ROM noted in all directions. Will continue with current POC as tolerated.  Eval: Patient is a 56 y.o. male who was seen today for physical therapy evaluation and treatment for left shoulder pain.Patient with fall last year and with increased pain and reduced mobility since fall. Patient presents with stiffness and pain limiting left  shoulder motion.Educated patient on current presentation and POC. Patent would greatly benefit from skilled PT to improve overall motion and function in left shoulder.      OBJECTIVE IMPAIRMENTS decreased activity tolerance, decreased ROM, decreased strength, impaired UE functional use, postural dysfunction, and pain.    ACTIVITY LIMITATIONS carrying, lifting, sleeping, and reach over head   PARTICIPATION LIMITATIONS: cleaning, occupation, and [place] Age and 1 comorbidity: history of right adhesive capsulitis  are also affecting patient's functional outcome.    REHAB POTENTIAL: Good   CLINICAL DECISION MAKING: Stable/uncomplicated   EVALUATION COMPLEXITY: Low   GOALS: Goals reviewed with patient?  yes   SHORT TERM GOALS:   Patient will be independent in self management strategies to improve quality of life and functional outcomes. Baseline: new program Target date: 02/23/2022 Goal status: INITIAL   2.  Patient will report at least 50% improvement in overall symptoms and/or function to demonstrate improved functional mobility Baseline: 0% Target date: 02/23/2022 Goal status: INITIAL   3.  Patient will be able to demonstrate at least 120 degrees of left shoulder flexion and abd to improve ability to reach overhead Baseline: see above Target date: 02/23/2022 Goal status: INITIAL           LONG TERM GOALS:   Patient will report at least 75% improvement in overall symptoms and/or function to demonstrate improved functional mobility Baseline: 0% Target date: 03/09/2022 Goal status: INITIAL   2.  Patient will be able to reach out to the side without pain to improve ability to coach basketball Baseline: unable Target date: 03/09/2022 Goal status: INITIAL   3.  Patient will be able to demonstrate WNL of left shoulder motion to improve functional mobility of left UE. Baseline: see above Target date: 03/09/2022 Goal status: INITIAL       PLAN: PT  FREQUENCY: 2x/week   PT DURATION: 8 weeks   PLANNED INTERVENTIONS: Therapeutic exercises, Therapeutic activity, Neuromuscular re-education, Balance training, Gait training, Patient/Family education, Joint mobilization, Stair training, Vestibular training, Orthotic/Fit training, DME instructions, Aquatic Therapy, Dry Needling, Cryotherapy, Ionotophoresis '4mg'$ /ml Dexamethasone, Manual therapy, and Re-evaluation   PLAN FOR NEXT SESSION: ROM, STM, DN, heat, iso    8:45 AM, 02/14/22 Jerene Pitch, DPT Physical Therapy with Royston Sinner

## 2022-02-16 ENCOUNTER — Encounter: Payer: Self-pay | Admitting: Physical Therapy

## 2022-02-16 ENCOUNTER — Ambulatory Visit: Payer: BC Managed Care – PPO | Admitting: Physical Therapy

## 2022-02-16 DIAGNOSIS — M6281 Muscle weakness (generalized): Secondary | ICD-10-CM

## 2022-02-16 DIAGNOSIS — M25512 Pain in left shoulder: Secondary | ICD-10-CM | POA: Diagnosis not present

## 2022-02-16 NOTE — Therapy (Addendum)
OUTPATIENT PHYSICAL THERAPY TREATMENT NOTE PHYSICAL THERAPY DISCHARGE SUMMARY  Visits from Start of Care: 5  Current functional level related to goals / functional outcomes: Unable to assess due to unplanned discharge    Remaining deficits: Unable to assess due to unplanned discharge    Education / Equipment: Unable to assess due to unplanned discharge    Patient agrees to discharge. Patient goals were not met. Patient is being discharged due to not returning since the last visit. 12:40 PM, 05/16/22 Jerene Pitch, DPT Physical Therapy with [facility]    Patient Name: Cristian Ellis MRN: [id number] DOB:09/03/1965, 56 y.o., male Today's Date: 02/16/2022   END OF SESSION:   PT End of Session - 02/16/22 0800     Visit Number 5    Number of Visits 16    Date for PT Re-Evaluation 03/23/22    Authorization Type BCBS    PT Start Time 0801    PT Stop Time 0840    PT Time Calculation (min) 39 min    Activity Tolerance Patient tolerated treatment well    Behavior During Therapy [facility] for tasks assessed/performed              History reviewed. No pertinent past medical history. History reviewed. No pertinent surgical history. Patient Active Problem List   Diagnosis Date Noted   Psoriatic arthritis ([place]) 01/18/2022   Osteoarthritis 01/18/2022   Malignant melanoma of skin ([place]) 01/18/2022    PCP: Merrilee Seashore, MD   REFERRING PROVIDER:  Gregor Hams, MD   REFERRING DIAG: [phone] (ICD-10-CM) - Chronic left shoulder pain   THERAPY DIAG:  Acute pain of left shoulder - Plan: PT plan of care cert/re-cert   Muscle weakness (generalized) - Plan: PT plan of care cert/re-cert   Rationale for Evaluation and Treatment Rehabilitation   ONSET DATE: Nov 2022   SUBJECTIVE:                                                                                                                                                                                        SUBJECTIVE STATEMENT: 02/16/2022 States that he is feeling better minimal pain. States that he played golf and the first 12 holes were fine but then the back 6 were difficult. Reports he is 70% better   Eval: Patient Had a fall  around thanksgiving 2022 and broke fall by reaching out with left arm. It was very jarring and painful but felt better. Continued to hurt with motion but not at rest. Did not get better and sought out MD. Has received two injections in his shoulder, 2nd injection seemed to help more than first.  States that when ever he makes a sudden movement he has pain. But that the pain doesn't linger. States that when he stretches it really hurts and so he doesn't want to stretch it   Reports he plays golf and doesn't want to play golf due to pain. Tennis doesn't bother it but he is right handed. 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    History of possible right frozen shoulder following R RCR.          PERTINENT HISTORY: Psoriatic  arthritis, R RCR with s/p frozen shoulder, Achille's repair   PAIN:7 Are you having pain? Yes: NPRS scale: 1/10 Pain location: top of left shoulder deltoid region Pain description: stiffness Aggravating factors: movement Relieving factors: rest, no contact   PRECAUTIONS: None   WEIGHT BEARING RESTRICTIONS No   FALLS:  Has patient fallen in last 6 months? No     OCCUPATION: Teacher and basketball coach   PLOF: Independent   PATIENT GOALS to have less pain and get arm back to normal   OBJECTIVE:    DIAGNOSTIC FINDINGS:  No recent xray [place] - no significant findings   COGNITION:           Overall cognitive status: Within functional limits for tasks assessed                                  SENSATION: WFL   POSTURE: Rounded shoulders, increased kyphosis, forward head                 UE Measurements        Upper Extremity Right 01/26/2022 Left 01/26/2022 Left 02/16/22    A/PROM MMT A/PROM  MMT A/PROM  Shoulder Flexion 150   92*   /140  Shoulder Extension           Shoulder Abduction 170   89* shoulder hike   95  Shoulder Adduction           Shoulder Internal rotation Reaches to right 12th rib/60 at 90 abd    Reaches to left ilium*/ at 45 abd 45*   Base of sacrum /50 (supine)  Shoulder External rotation Reaches to T2SP - shoulder hike*/ 90 at 90 abd   Reaches to T2SP/ at 45 abd 32*      (Blank rows = not tested)            * pain         JOINT MOBILITY TESTING:  Empty end feel left shoulder    PALPATION:  Tenderness to palpation along left shoulder girdle and muscles             TODAY'S TREATMENT:  02/16/2022 Therapeutic Exercise:            Aerobic: Supine:  Prone:            Standing: shoulder flexion AAROM at wall with ball 3 minutes, pulleys 3 minutes, shoulder ER at wall 3 minutes, shoulder extension dowel 3 minutes, shoulder reaching to the side with dowel 3 minutes total, reaching up behind back 3 minutes, golf swing with practice 3 mintues, shoulder flexion with back at wall with cane 3 minutes,   Neuromuscular Re-education:  Therapeutic Activity:   Previous:  Therapeutic Exercise:            Aerobic: Supine: neck ROT with scap retraction x10 5" holds,  AAROM shoulder flexion dowel  x 20;  shoulder retraction x20 5" holds B, shoulder ER holds black band x20 5" holds  Prone:            Seated: AAROM shoulder flexion table slide 3 mintues             Standing: shoulder Ext dowel 2 minutes -cuse for posture Neuromuscular Re-education: Manual Therapy: PROM to left shoulder contract/relax gentle traction - all directions, STM to left shoulder muscles. Therapeutic Activity: Self Care: Trigger Point Dry Needling:  Modalities:        PATIENT EDUCATION:  Education details: on HEP, on current presentation, on limiting golf when he starts having pain, on simple back stretches Person educated: Patient Education method: Explanation, Demonstration, and  Handouts Education comprehension: verbalized understanding     HOME EXERCISE PROGRAM: [id number]   ASSESSMENT:   CLINICAL IMPRESSION: 02/16/2022 Patient  improving in ROM. Discussed progress, return to  full round of golf/tennis and stopping when he starts having back pain. Discussed and educated patient on basic low back stretches and to monitor/control back motion as her reaches up overhead.   Eval: Patient is a 56 y.o. male who was seen today for physical therapy evaluation and treatment for left shoulder pain.Patient with fall last year and with increased pain and reduced mobility since fall. Patient presents with stiffness and pain limiting left shoulder motion.Educated patient on current presentation and POC. Patent would greatly benefit from skilled PT to improve overall motion and function in left shoulder.      OBJECTIVE IMPAIRMENTS decreased activity tolerance, decreased ROM, decreased strength, impaired UE functional use, postural dysfunction, and pain.    ACTIVITY LIMITATIONS carrying, lifting, sleeping, and reach over head   PARTICIPATION LIMITATIONS: cleaning, occupation, and [place] Age and 1 comorbidity: history of right adhesive capsulitis  are also affecting patient's functional outcome.    REHAB POTENTIAL: Good   CLINICAL DECISION MAKING: Stable/uncomplicated   EVALUATION COMPLEXITY: Low   GOALS: Goals reviewed with patient?  yes   SHORT TERM GOALS:   Patient will be independent in self management strategies to improve quality of life and functional outcomes. Baseline: new program Target date: 02/23/2022 Goal status: MET   2.  Patient will report at least 50% improvement in overall symptoms and/or function to demonstrate improved functional mobility Baseline: 0% Target date: 02/23/2022 Goal status: MET   3.  Patient will be able to demonstrate at least 120 degrees of left shoulder flexion and abd to improve ability to reach  overhead Baseline: see above Target date: 02/23/2022 Goal status: MET           LONG TERM GOALS:   Patient will report at least 75% improvement in overall symptoms and/or function to demonstrate improved functional mobility Baseline: 0% Target date: 03/09/2022 Goal status: PROGRESSING   2.  Patient will be able to reach out to the side without pain to improve ability to coach basketball Baseline: unable Target date: 03/09/2022 Goal status: INITIAL   3.  Patient will be able to demonstrate WNL of left shoulder motion to improve functional mobility of left UE. Baseline: see above Target date: 03/09/2022 Goal status: PROGRESSING       PLAN: PT FREQUENCY: 2x/week   PT DURATION: 8 weeks   PLANNED INTERVENTIONS: Therapeutic exercises, Therapeutic activity, Neuromuscular re-education, Balance training, Gait training, Patient/Family education, Joint mobilization, Stair training, Vestibular training, Orthotic/Fit training, DME instructions, Aquatic Therapy, Dry Needling, Cryotherapy, Ionotophoresis 41m/ml Dexamethasone, Manual therapy, and Re-evaluation   PLAN FOR  NEXT SESSION: on hold from PT - will DC at end of POS if patient does not return     8:54 AM, 02/16/22 Jerene Pitch, DPT Physical Therapy with [facility]

## 2022-02-25 NOTE — Progress Notes (Unsigned)
   I, Peterson Lombard, LAT, ATC acting as a scribe for Lynne Leader, MD.  Cristian Ellis is a 56 y.o. male who presents to [place] at [facility] today for f/u chronic L shoulder pain in the setting of psoriatic arthritis and a possible history of right shoulder adhesive capsulitis. Pt was last seen by Dr. Georgina Snell on 01/18/22 and was given a L GH steroid injection and was referred to PT, completing 5 visits. Today, pt reports  Dx imaging: L shoulder XR at [facility]'s office  Pertinent review of systems: ***  Relevant historical information: ***   Exam:  There were no vitals taken for this visit. General: Well Developed, well nourished, and in no acute distress.   MSK: ***    Lab and Radiology Results No results found for this or any previous visit (from the past 72 hour(s)). No results found.     Assessment and Plan: 56 y.o. male with ***   PDMP not reviewed this encounter. No orders of the defined types were placed in this encounter.  No orders of the defined types were placed in this encounter.    Discussed warning signs or symptoms. Please see discharge instructions. Patient expresses understanding.   ***

## 2022-03-01 ENCOUNTER — Ambulatory Visit (INDEPENDENT_AMBULATORY_CARE_PROVIDER_SITE_OTHER): Payer: BC Managed Care – PPO | Admitting: Family Medicine

## 2022-03-01 VITALS — BP 114/76 | HR 61 | Ht 74.0 in | Wt 206.4 lb

## 2022-03-01 DIAGNOSIS — M7502 Adhesive capsulitis of left shoulder: Secondary | ICD-10-CM

## 2022-03-01 DIAGNOSIS — G8929 Other chronic pain: Secondary | ICD-10-CM | POA: Diagnosis not present

## 2022-03-01 DIAGNOSIS — M25512 Pain in left shoulder: Secondary | ICD-10-CM | POA: Diagnosis not present

## 2022-03-01 NOTE — Patient Instructions (Addendum)
Thank you for coming in today.   Check back as needed

## 2022-05-18 IMAGING — MR MR ANKLE*L* WO/W CM
9 series · 38 of 40 positions shown · IV contrast (multihance)
Comparison: None.

CLINICAL DATA: Left foot mass along the heel for 6 months.

EXAM:
MRI OF THE LEFT ANKLE WITHOUT AND WITH CONTRAST
TECHNIQUE: Multiplanar, multisequence MR imaging of the ankle was performed
before and after the administration of intravenous contrast.
CONTRAST:  19mL MULTIHANCE GADOBENATE DIMEGLUMINE 529 MG/ML IV SOLN

[Series 4: T2 fat-sat · axial · 3.0mm · 0.50mm/px · z∈[-129,+23]mm · 6 of 40 slices shown (1 of 2)]
[im 1/40]
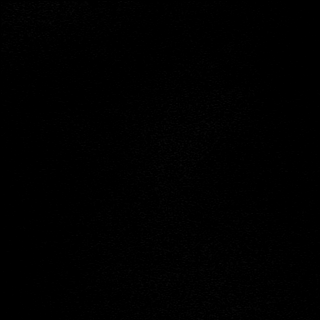
[im 8/40]
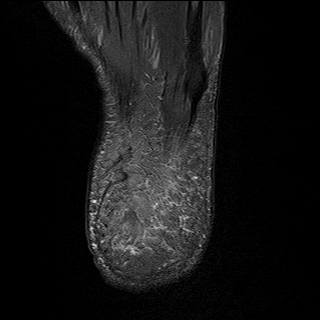
[im 16/40]
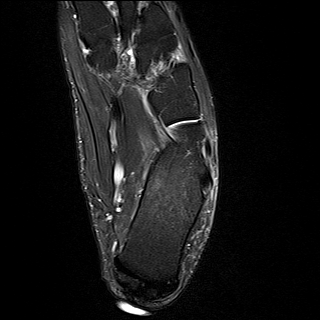
[im 24/40]
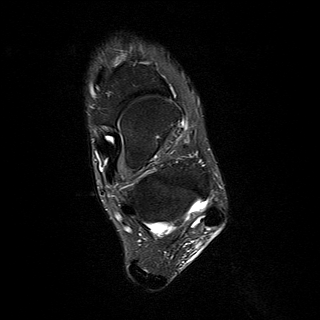
[im 32/40]
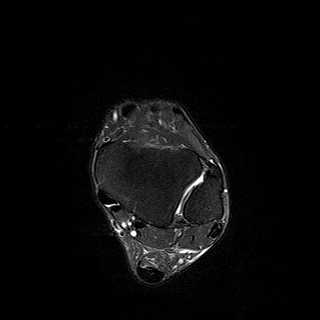
[im 40/40]
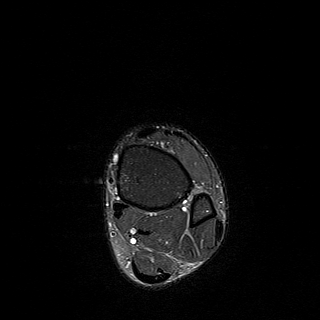

[Series 5: T1 · axial · 3.0mm · 0.62mm/px · z∈[-129,+23]mm · 5 of 40 slices shown (1 of 2)]
[im 1/40]
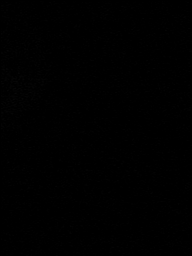
[im 10/40]
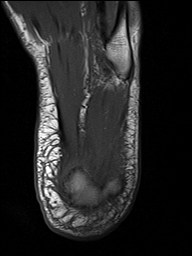
[im 20/40]
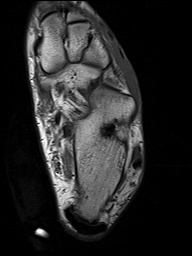
[im 30/40]
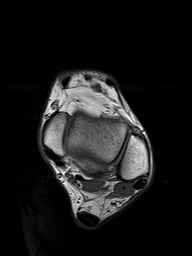
[im 40/40]
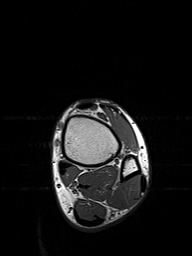

[Series 6: T1 fat-sat · axial · 3.0mm · 0.62mm/px · z∈[-129,+23]mm · 5 of 40 slices shown]
[im 1/40]
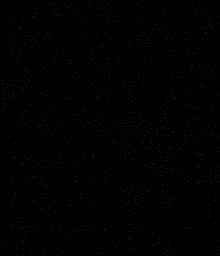
[im 10/40]
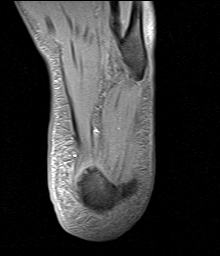
[im 20/40]
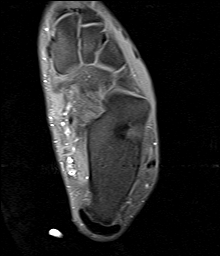
[im 30/40]
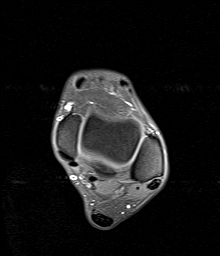
[im 40/40]
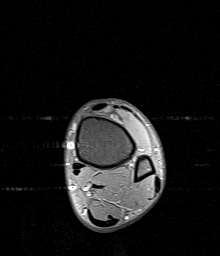

[Series 7: STIR · sagittal · 4.0mm · 0.35mm/px · 3 of 22 slices shown]
[im 1/22]
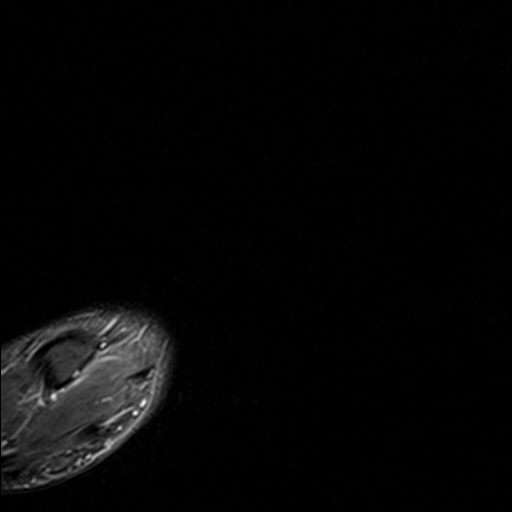
[im 11/22]
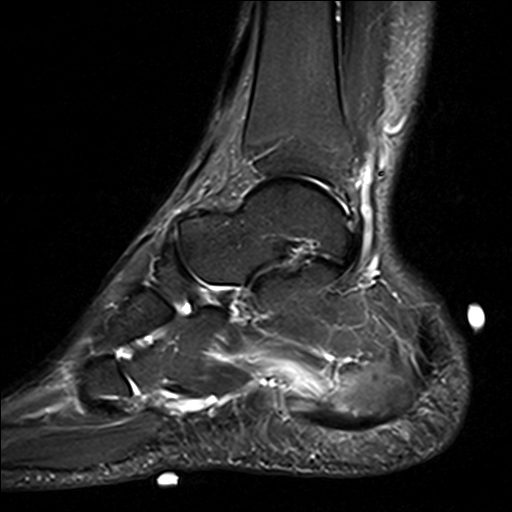
[im 22/22]
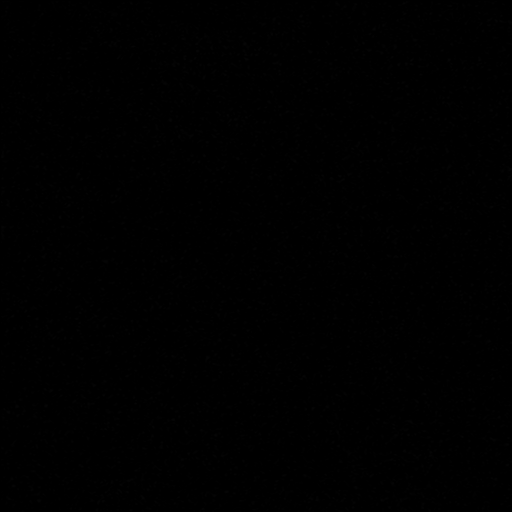

[Series 8: T2 fat-sat · coronal · 3.0mm · 0.50mm/px · 5 of 40 slices shown (2 of 2)]
[im 1/40]
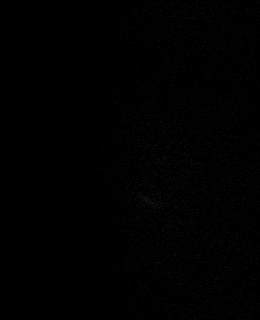
[im 10/40]
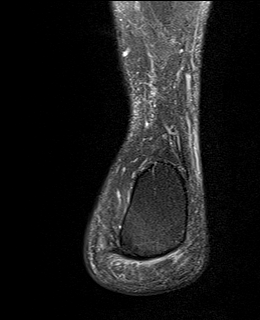
[im 20/40]
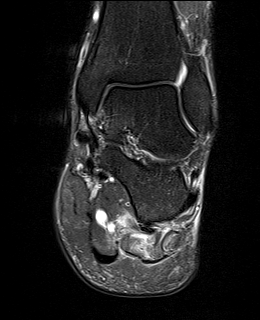
[im 30/40]
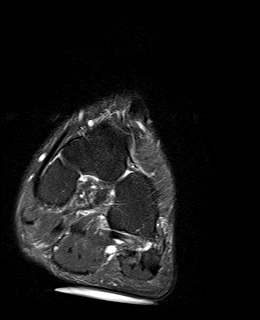
[im 40/40]
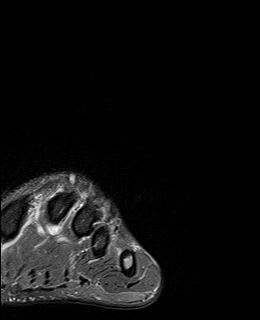

[Series 9: T1 · sagittal · 4.0mm · 0.56mm/px · 3 of 22 slices shown (2 of 2)]
[im 1/22]
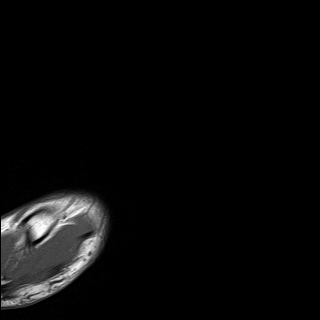
[im 11/22]
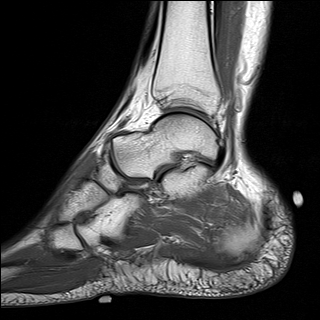
[im 22/22]
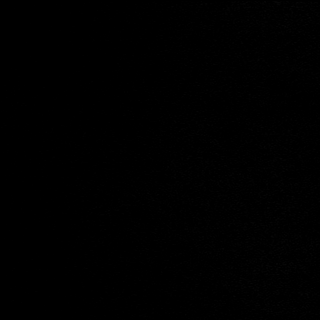

[Series 10: post fs cor · coronal · 3.0mm · 0.78mm/px · 5 of 40 slices shown]
[im 1/40]
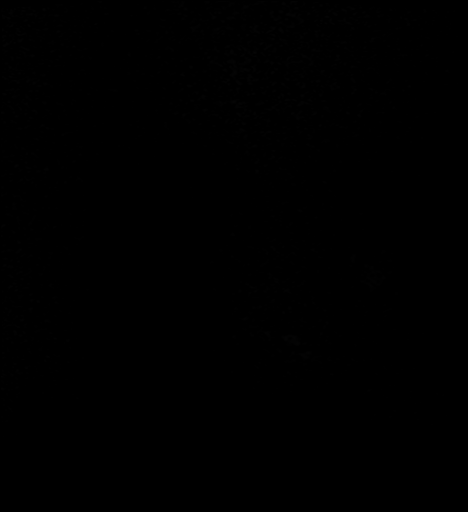
[im 10/40]
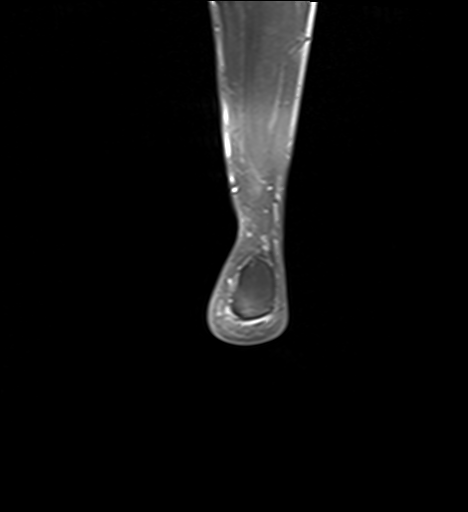
[im 20/40]
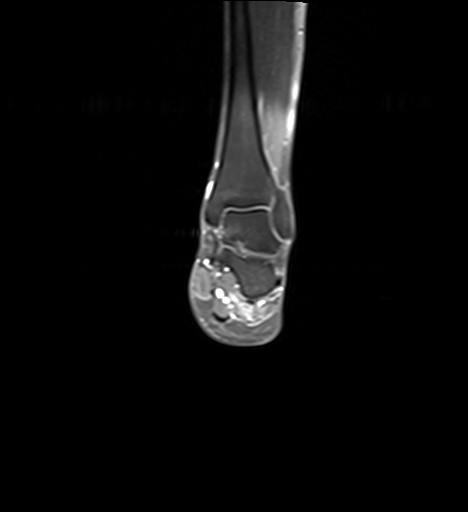
[im 30/40]
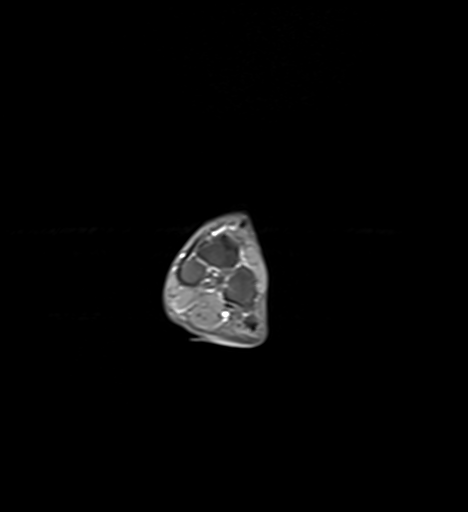
[im 40/40]
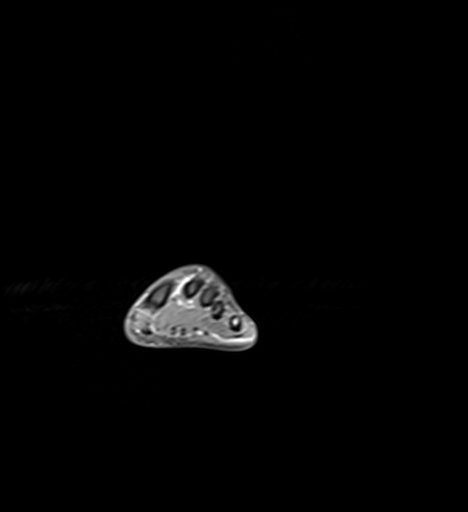

[Series 13: post fs axial · axial · 3.0mm · 0.74mm/px · z∈[-130,+23]mm · 5 of 40 slices shown]
[im 1/40]
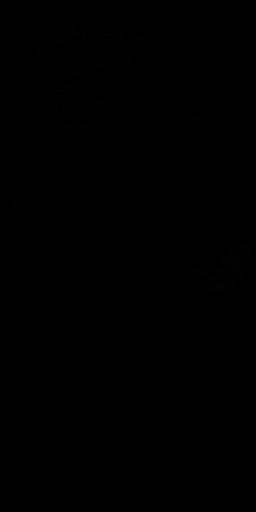
[im 10/40]
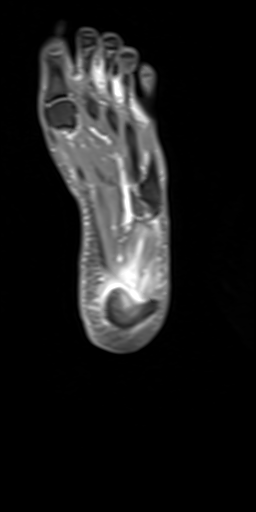
[im 20/40]
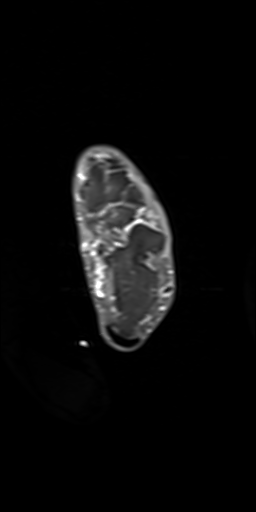
[im 30/40]
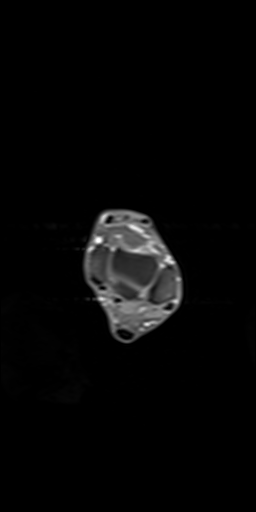
[im 40/40]
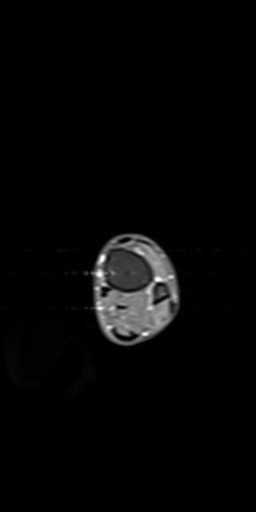

[Series 14: post sag fs · sagittal · 4.0mm · 0.74mm/px · 1 of 22 slices shown]
[im 1/22]
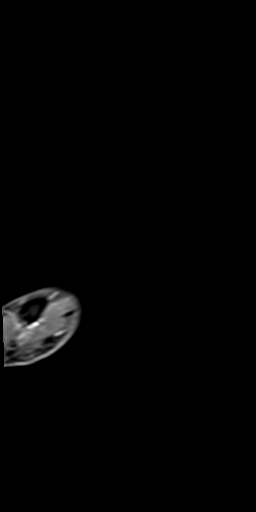

[38 of 40 positions shown; findings below may reference images not displayed]

FINDINGS: TENDONS

Peroneal: Peroneal longus tendon intact. Peroneal brevis intact.

Posteromedial: Posterior tibial tendon intact. Flexor hallucis
longus tendon intact. Flexor digitorum longus tendon intact.

Anterior: Tibialis anterior tendon intact. Extensor hallucis longus
tendon intact Extensor digitorum longus tendon intact.

Achilles: Intact. Enthesopathic changes of the Achilles tendon
insertion.

Plantar Fascia: Thickening of the medial band of the plantar fascia
with surrounding soft tissue edema, edema in the abductor digiti
minimi, edema in flexor digitorum brevis, and subcortical marrow
edema.

LIGAMENTS

Lateral: Anterior talofibular ligament intact. Calcaneofibular
ligament intact. Posterior talofibular ligament intact. Anterior and
posterior tibiofibular ligaments intact.

Medial: Deltoid ligament intact. Spring ligament intact.

CARTILAGE

Ankle Joint: No joint effusion. Normal ankle mortise. No chondral
defect.

Subtalar Joints/Sinus Tarsi: Normal subtalar joints. No subtalar
joint effusion. Normal sinus tarsi.

Bones: No marrow signal abnormality.  No fracture or dislocation.

Soft Tissue: No fluid collection or hematoma. Muscles are normal
without edema or atrophy. Tarsal tunnel is normal.
IMPRESSION: IMPRESSION
1. Plantar fasciitis with subcortical marrow edema at the calcaneal
insertion. Muscle edema in the abductor digiti minimi and flexor
digitorum brevis likely reflecting muscle strain.
2. Enthesopathic changes of the Achilles tendon insertion. Achilles
tendon is intact.
